# Patient Record
Sex: Male | Born: 1980 | Race: White | Hispanic: No | Smoking: Current every day smoker
Health system: Southern US, Community
[De-identification: ages and names within clinical notes are randomized; demographics above are authoritative.]

## PROBLEM LIST (undated history)

## (undated) DIAGNOSIS — F4310 Post-traumatic stress disorder, unspecified: Secondary | ICD-10-CM

## (undated) DIAGNOSIS — F419 Anxiety disorder, unspecified: Secondary | ICD-10-CM

---

## 2019-10-10 ENCOUNTER — Encounter (HOSPITAL_BASED_OUTPATIENT_CLINIC_OR_DEPARTMENT_OTHER): Payer: Self-pay

## 2019-10-10 ENCOUNTER — Other Ambulatory Visit: Payer: Self-pay

## 2019-10-10 DIAGNOSIS — F17210 Nicotine dependence, cigarettes, uncomplicated: Secondary | ICD-10-CM | POA: Diagnosis not present

## 2019-10-10 DIAGNOSIS — Y999 Unspecified external cause status: Secondary | ICD-10-CM | POA: Diagnosis not present

## 2019-10-10 DIAGNOSIS — Y929 Unspecified place or not applicable: Secondary | ICD-10-CM | POA: Insufficient documentation

## 2019-10-10 DIAGNOSIS — T63001A Toxic effect of unspecified snake venom, accidental (unintentional), initial encounter: Secondary | ICD-10-CM | POA: Insufficient documentation

## 2019-10-10 DIAGNOSIS — Y939 Activity, unspecified: Secondary | ICD-10-CM | POA: Diagnosis not present

## 2019-10-10 NOTE — ED Triage Notes (Addendum)
Pt states he was bit by non venomous snake at ~2pm-abrasions/scratch like marks to hand/swelling noted-reports pain from hand to upper forearm-NAD-steady gait

## 2019-10-11 ENCOUNTER — Emergency Department (HOSPITAL_BASED_OUTPATIENT_CLINIC_OR_DEPARTMENT_OTHER)
Admit: 2019-10-11 | Discharge: 2019-10-11 | Disposition: A | Discharge status: Home / Self Care | Attending: Emergency Medicine | Admitting: Emergency Medicine

## 2019-10-11 ENCOUNTER — Emergency Department (HOSPITAL_BASED_OUTPATIENT_CLINIC_OR_DEPARTMENT_OTHER)
Admission: EM | Admit: 2019-10-11 | Discharge: 2019-10-11 | Disposition: A | Discharge status: Home / Self Care | Attending: Emergency Medicine | Admitting: Emergency Medicine

## 2019-10-11 ENCOUNTER — Telehealth (HOSPITAL_BASED_OUTPATIENT_CLINIC_OR_DEPARTMENT_OTHER): Payer: Self-pay | Admitting: Emergency Medicine

## 2019-10-11 DIAGNOSIS — T63001A Toxic effect of unspecified snake venom, accidental (unintentional), initial encounter: Secondary | ICD-10-CM

## 2019-10-11 HISTORY — DX: Anxiety disorder, unspecified: F41.9

## 2019-10-11 HISTORY — DX: Post-traumatic stress disorder, unspecified: F43.10

## 2019-10-11 MED ORDER — CEFAZOLIN SODIUM-DEXTROSE 1-4 GM/50ML-% IV SOLN
1.0000 g | Freq: Once | INTRAVENOUS | Status: AC
  Administered 2019-10-11: 1 g via INTRAVENOUS
  Filled 2019-10-11: qty 50

## 2019-10-11 MED ORDER — CEPHALEXIN 500 MG PO CAPS
500.0000 mg | ORAL_CAPSULE | Freq: Four times a day (QID) | ORAL | 0 refills | Status: AC
Start: 2019-10-11 — End: ?

## 2019-10-11 NOTE — Telephone Encounter (Signed)
Patient here for outpatient [place] of right upper extremity. Reviewed results with Lajuana Carry PAC. Advised patient of negative results for DVT in right upper extremity. Patient advised he can take tylenol, ibuprofen, and benadryl as needed and to continue antibiotics as prescribed last PM. Instructed to elevate and apply ice as needed. Advised to measure area for increased swelling. Advised to return for any concerns or changes. Verbalized understanding.

## 2019-10-11 NOTE — ED Provider Notes (Signed)
[facility] Provider Note: Lowella Dell, MD, FACEP  CSN: [id number] MRN: [id number] ARRIVAL: 10/10/19 at 2250 ROOM: [facility]12/[facility]   CHIEF COMPLAINT  Snake bite   HISTORY OF PRESENT ILLNESS  10/11/19 2:35 AM Ethan Richards. is a 39 y.o. male who was bitten by a garter snake on his right hand about 2 PM yesterday afternoon.  The patient actually allow the [place] steak to chew on his hand while family took photographs:    He has subsequently developed swelling, mild ecchymosis and pain in his right hand.  He rates the pain as a 4 out of 10, worse with movement or palpation.  He still notes intact sensation and brisk capillary refill distally.  He has also noticed ecchymosis of the right antecubital fossa, remote from the bite site.  He reports that garter snakes, previously thought to be nonvenomous, have actually been found to be venomous.  There venom is not considered seriously toxic to humans however:    He denies any systemic complaints such as fever, chills, nausea or shortness of breath.  Past Medical History:  Diagnosis Date  . Anxiety   . PTSD (post-traumatic stress disorder)     History reviewed. No pertinent surgical history.  No family history on file.  Social History   Tobacco Use  . Smoking status: Current Every Day Smoker    Types: Cigarettes  . Smokeless tobacco: Never Used  Vaping Use  . Vaping Use: Never used  Substance Use Topics  . Alcohol use: Yes    Comment: occ  . Drug use: Never    Prior to Admission medications   Medication Sig Start Date End Date Taking? Authorizing Provider  cephALEXin (KEFLEX) 500 MG capsule Take 1 capsule (500 mg total) by mouth 4 (four) times daily. 10/11/19   Betzalel Umbarger, MD    Allergies Sulfa antibiotics   REVIEW OF SYSTEMS  Negative except as noted here or in the History of Present Illness.   PHYSICAL EXAMINATION  Initial Vital Signs Blood pressure (!) 139/94, pulse 95, temperature 99 F  (37.2 C), temperature source Oral, resp. rate 16, height 5\' 8"  (1.727 m), weight 126.1 kg, SpO2 98 %.  Examination General: Well-developed, well-nourished male in no acute distress; appearance consistent with age of record HENT: normocephalic; atraumatic Eyes: pupils equal, round and reactive to light; extraocular muscles intact Neck: supple Heart: regular rate and rhythm Lungs: clear to auscultation bilaterally Abdomen: soft; nondistended; nontender; bowel sounds present Extremities: No deformity; pulses normal; scratch marks consistent with reported chewing bite of garter snake to right hand with edema and ecchymosis of right hand, hand is distally neurovascularly intact:    Neurologic: Awake, alert and oriented; motor function intact in all extremities and symmetric; no facial droop Skin: Warm and dry; ecchymosis of right antecubital fossa remote from site of reported snakebite:    Psychiatric: Normal mood and affect   RESULTS  Summary of this visit's results, reviewed and interpreted by myself:   EKG Interpretation  Date/Time:    Ventricular Rate:    PR Interval:    QRS Duration:   QT Interval:    QTC Calculation:   R Axis:     Text Interpretation:        Laboratory Studies: No results found for this or any previous visit (from the past 24 hour(s)). Imaging Studies: No results found.  ED COURSE and MDM  Nursing notes, initial and subsequent vitals signs, including pulse oximetry, reviewed and interpreted by myself.  Vitals:   10/10/19 2305 10/11/19 0200  BP: (!) 153/100 (!) 139/94  Pulse: (!) 121 95  Resp: 20 16  Temp: 99 F (37.2 C)   TempSrc: Oral   SpO2: 99% 98%  Weight: 126.1 kg   Height: 5\' 8"  (1.727 m)    Medications  ceFAZolin (ANCEF) IVPB 1 g/50 mL premix (1 g Intravenous New Bag/Given 10/11/19 0307)    2:53 AM 1 g Ancef IV given for wound prophylaxis.  The patient is not showing signs of serious envenomation (no signs of compartment  syndrome) and no antivenom for garter snakes is available commercially.  PROCEDURES  Procedures   ED DIAGNOSES     ICD-10-CM   1. Venomous snake bite, accidental or unintentional, initial encounter  T63.001A        Yoshio Seliga, 10/13/19, MD 10/11/19 [phone]

## 2022-03-31 IMAGING — US US EXTREM  UP VENOUS*R*
1 series · 13 of 24 positions shown · non-contrast
Comparison: None.

CLINICAL DATA: Upper extremity swelling after snake bite



[Series 1: us extrem up venous*right* · 13 of 34 slices shown]
[im 1/34]
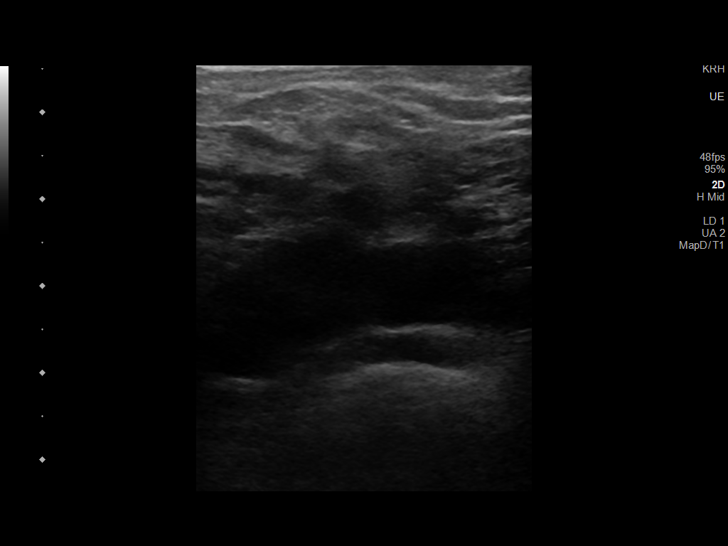
[im 3/34]
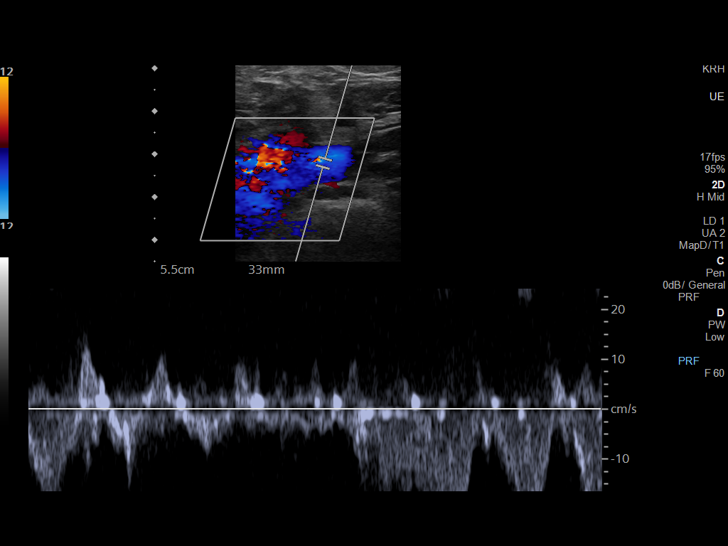
[im 6/34]
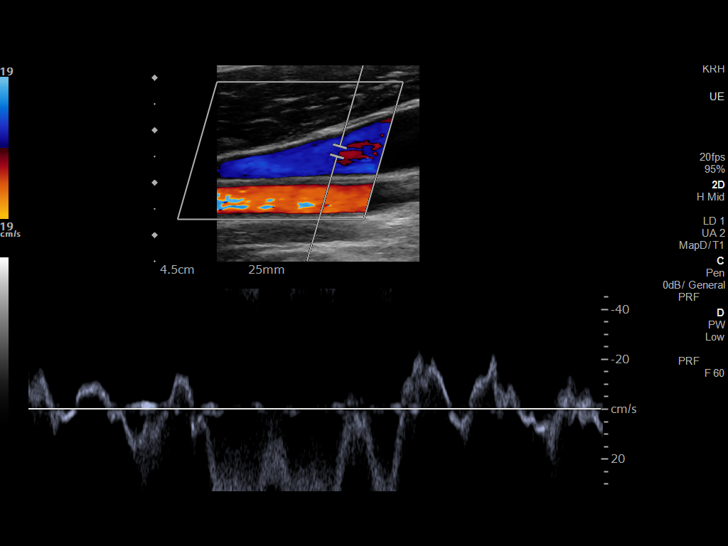
[im 9/34]
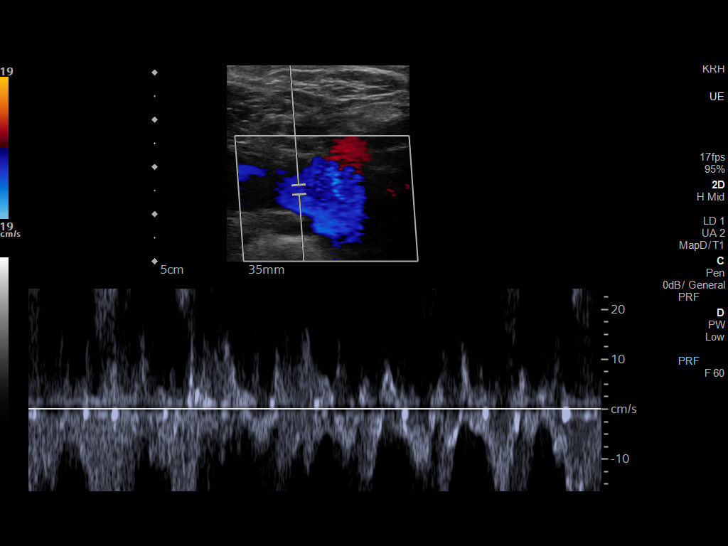
[im 12/34]
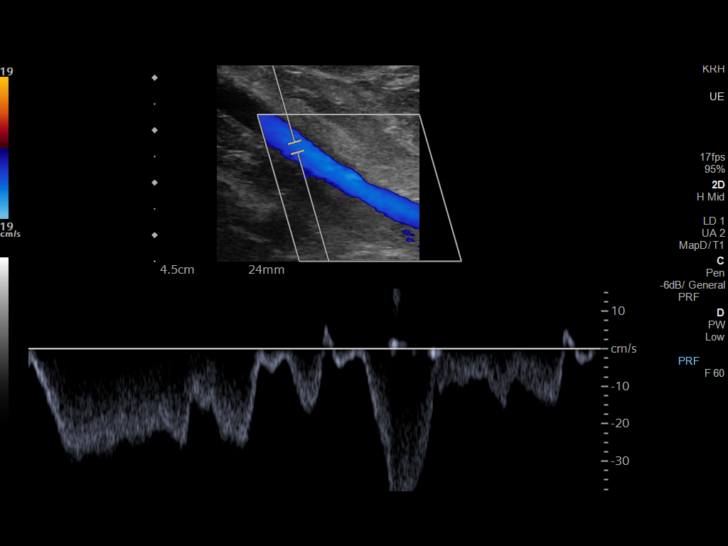
[im 15/34]
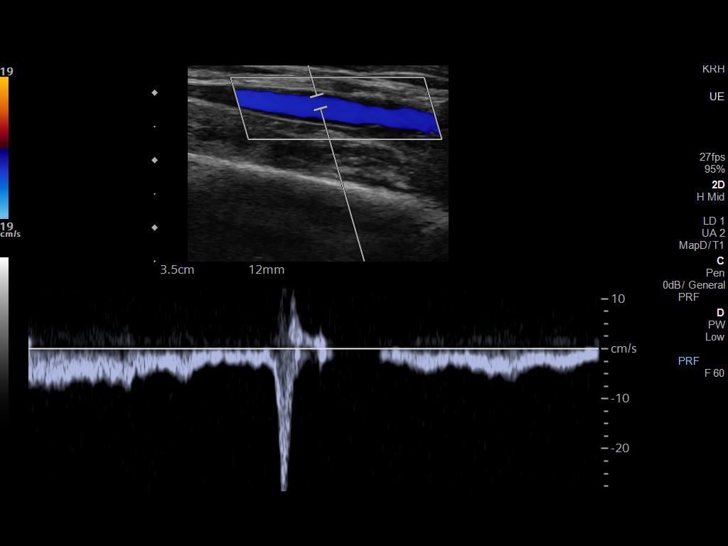
[im 18/34]
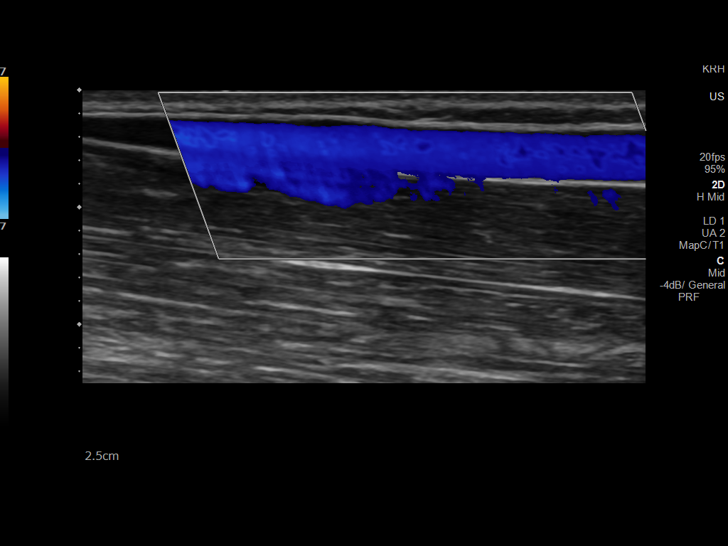
[im 19/34]
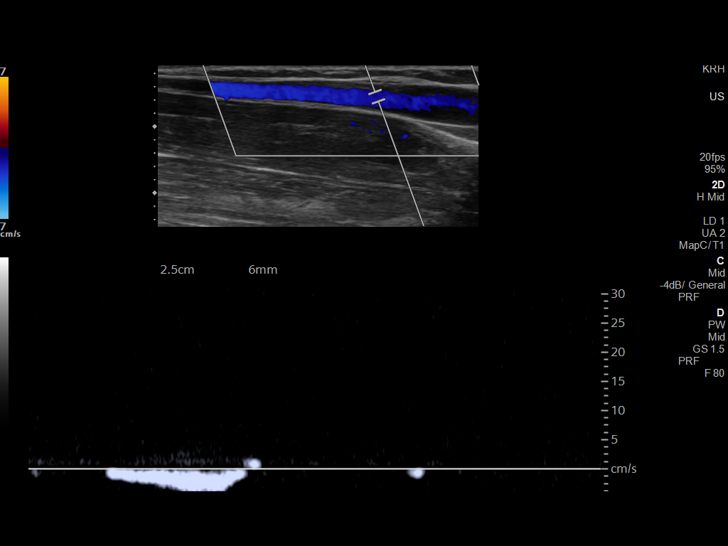
[im 22/34]
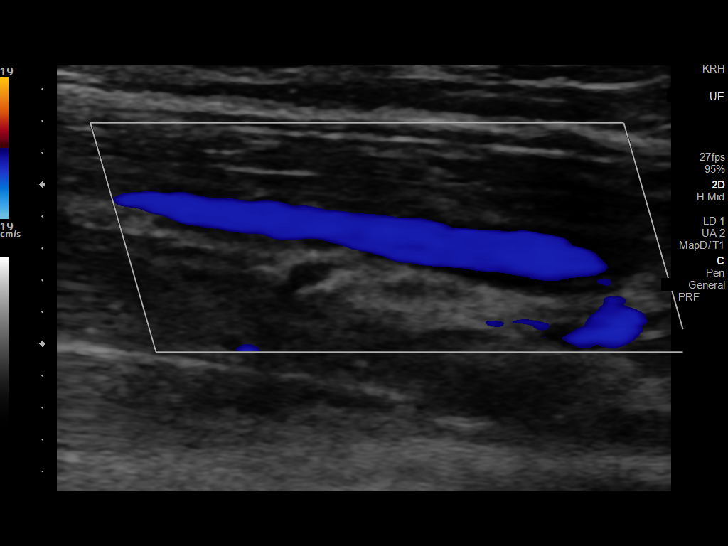
[im 25/34]
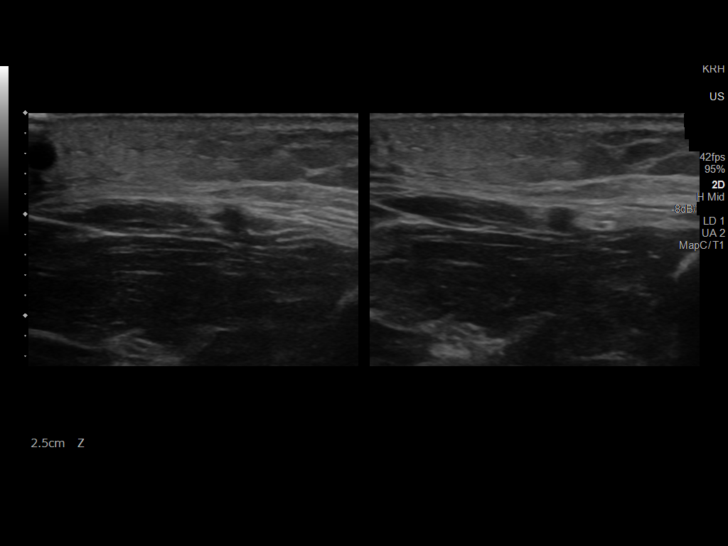
[im 28/34]
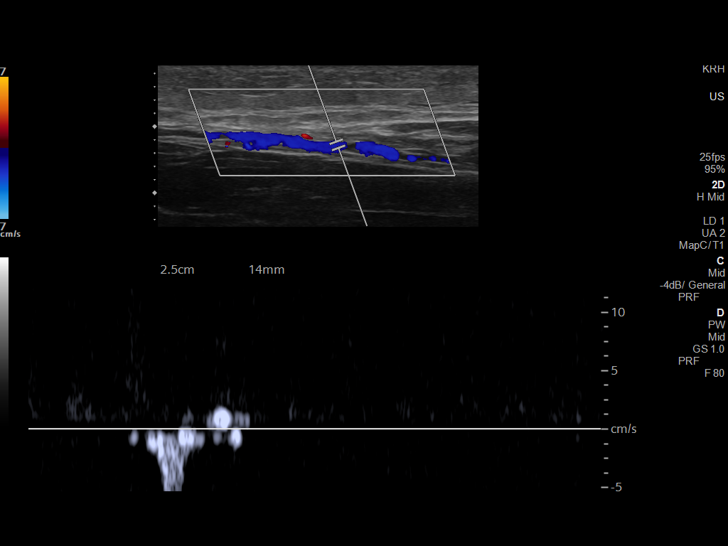
[im 31/34]
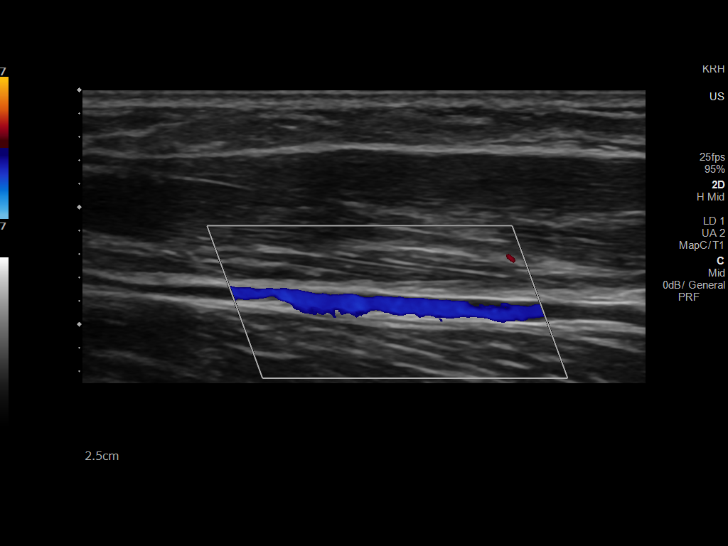
[im 34/34]
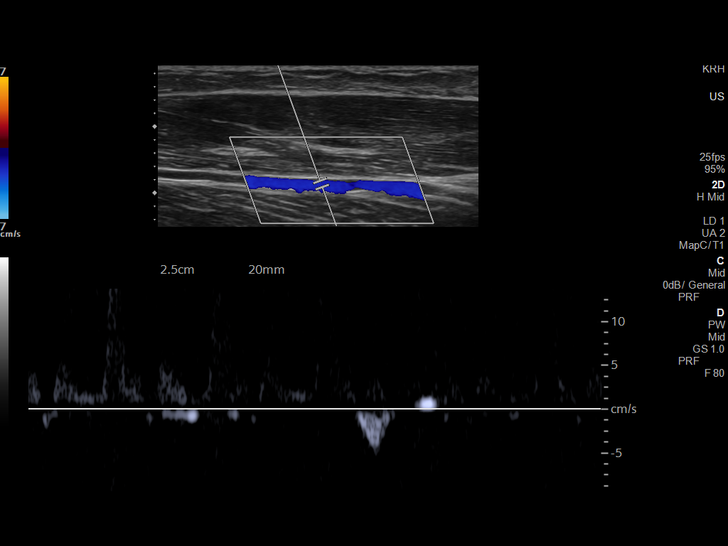

[13 of 24 positions shown; findings below may reference images not displayed]

FINDINGS: Contralateral Subclavian Vein: Respiratory phasicity is normal and
symmetric with the symptomatic side. No evidence of thrombus. Normal
compressibility.

Internal Jugular Vein: No evidence of thrombus. Normal
compressibility, respiratory phasicity and response to augmentation.

Subclavian Vein: No evidence of thrombus. Normal compressibility,
respiratory phasicity and response to augmentation.

Axillary Vein: No evidence of thrombus. Normal compressibility,
respiratory phasicity and response to augmentation.

Cephalic Vein: No evidence of thrombus. Normal compressibility,
respiratory phasicity and response to augmentation.

Basilic Vein: No evidence of thrombus. Normal compressibility,
respiratory phasicity and response to augmentation.

Brachial Veins: No evidence of thrombus. Normal compressibility,
respiratory phasicity and response to augmentation.

Radial Veins: No evidence of thrombus. Normal compressibility,
respiratory phasicity and response to augmentation.

Ulnar Veins: No evidence of thrombus. Normal compressibility,
respiratory phasicity and response to augmentation.

Other Findings:  Superficial edema throughout the right forearm.
IMPRESSION: No evidence of DVT within the right upper extremity.
